# Patient Record
Sex: Female | Born: 1990 | Race: White | Hispanic: No | Marital: Single | Smoking: Never smoker
Health system: Southern US, Community
[De-identification: ages and names within clinical notes are randomized; demographics above are authoritative.]

---
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
## 2010-07-01 ENCOUNTER — Emergency Department (HOSPITAL_COMMUNITY): Admission: EM | Admit: 2010-07-01 | Discharge: 2010-07-01 | Payer: Self-pay | Admitting: Emergency Medicine

## 2022-05-05 ENCOUNTER — Encounter (HOSPITAL_COMMUNITY): Payer: Self-pay

## 2022-05-05 ENCOUNTER — Ambulatory Visit (HOSPITAL_COMMUNITY)
Admission: EM | Admit: 2022-05-05 | Discharge: 2022-05-05 | Disposition: A | Discharge status: Home / Self Care | Payer: Medicaid Other | Attending: Physician Assistant | Admitting: Physician Assistant

## 2022-05-05 DIAGNOSIS — R519 Headache, unspecified: Secondary | ICD-10-CM

## 2022-05-05 DIAGNOSIS — L03211 Cellulitis of face: Secondary | ICD-10-CM

## 2022-05-05 MED ORDER — IBUPROFEN 600 MG PO TABS: 600.0000 mg | ORAL_TABLET | Freq: Three times a day (TID) | ORAL | 0 refills | Status: AC

## 2022-05-05 MED ORDER — SULFAMETHOXAZOLE-TRIMETHOPRIM 800-160 MG PO TABS: 1.0000 | ORAL_TABLET | Freq: Two times a day (BID) | ORAL | 0 refills | Status: AC

## 2022-05-05 NOTE — ED Triage Notes (Signed)
Patient had pimple pop up yesterday on the left upper lip. States tried to pop and pick at it. Area is now inflamed, swollen, and red. Pain radiating up into the left ear.

## 2022-05-05 NOTE — Discharge Instructions (Signed)
Advised take ibuprofen 600 mg every 8 hours with food to help reduce the pain and swelling. Advise use ice therapy, 10 minutes on 20 minutes off, 3-4 times a day to help reduce the swelling and pain. Advised to take the Bactrim DS 1 every 12 hours to treat the cellulitis. Advised to follow-up PCP or return to urgent care if symptoms fail to improve.

## 2022-05-05 NOTE — ED Provider Notes (Signed)
[facility]    CSN: [id number] Arrival date & time: 05/05/22  1658      History   Chief Complaint Chief Complaint  Patient presents with   Sore    Left upper lip    HPI Judy Lopez is a 31 y.o. female.   31 year old female presents with left facial pain.  Patient indicates that she had a sore on the left facial area next to the upper lip when she started picking at the area.  She relates over the past 2 days the areas become large, swollen, red, with tenderness and pain.  She relates that the area of redness has widened and she has pain up into the left ear.  She has taken ibuprofen 600 mg which tends to give some relief from the pain and discomfort.  She denies any fever or chills     History reviewed. No pertinent past medical history.  There are no problems to display for this patient.   Past Surgical History:  Procedure Laterality Date   CESAREAN SECTION      OB History   No obstetric history on file.      Home Medications    Prior to Admission medications   Medication Sig Start Date End Date Taking? Authorizing Provider  ibuprofen (ADVIL) 600 MG tablet Take 1 tablet (600 mg total) by mouth 3 (three) times daily. 05/05/22  Yes Ellsworth Lennox, PA-C  sulfamethoxazole-trimethoprim (BACTRIM DS) 800-160 MG tablet Take 1 tablet by mouth 2 (two) times daily for 7 days. 05/05/22 05/12/22 Yes Ellsworth Lennox, PA-C    Family History History reviewed. No pertinent family history.  Social History Social History   Tobacco Use   Smoking status: Never   Smokeless tobacco: Never  Vaping Use   Vaping Use: Every day  Substance Use Topics   Alcohol use: Yes   Drug use: Never     Allergies   Patient has no known allergies.   Review of Systems Review of Systems  Skin:  Positive for wound (left facial sore).     Physical Exam Triage Vital Signs ED Triage Vitals  Enc Vitals Group     BP 05/05/22 1729 127/88     Pulse Rate 05/05/22 1729 61     Resp  05/05/22 1729 16     Temp 05/05/22 1729 98.5 F (36.9 C)     Temp Source 05/05/22 1729 Oral     SpO2 05/05/22 1729 98 %     Weight --      Height 05/05/22 1731 5\' 7"  (1.702 m)     Head Circumference --      Peak Flow --      Pain Score 05/05/22 1731 6     Pain Loc --      Pain Edu? --      Excl. in [place]? --    No data found.  Updated Vital Signs BP 127/88 (BP Location: Left Arm)   Pulse 61   Temp 98.5 F (36.9 C) (Oral)   Resp 16   Ht 5\' 7"  (1.702 m)   LMP 04/29/2022 (Approximate)   SpO2 98%   Visual Acuity Right Eye Distance:   Left Eye Distance:   Bilateral Distance:    Right Eye Near:   Left Eye Near:    Bilateral Near:     Physical Exam Constitutional:      Appearance: Normal appearance.  HENT:     Right Ear: Tympanic membrane and ear canal normal.  Left Ear: Tympanic membrane and ear canal normal.     Mouth/Throat:     Mouth: Mucous membranes are moist.     Pharynx: Oropharynx is clear.  Lymphadenopathy:     Cervical: No cervical adenopathy.  Skin:    Comments: Facial: There is a 0.25 cm pustular crusting area above the upper lip at the outer border that has surrounding redness and 1+ swelling.  There is no active drainage and there is no streaking although there is tenderness 2 to 3 inches away from the wound site.  Neurological:     Mental Status: She is alert.      UC Treatments / Results  Labs (all labs ordered are listed, but only abnormal results are displayed) Labs Reviewed - No data to display  EKG   Radiology No results found.  Procedures Procedures (including critical care time)  Medications Ordered in UC Medications - No data to display  Initial Impression / Assessment and Plan / UC Course  I have reviewed the triage vital signs and the nursing notes.  Pertinent labs & imaging results that were available during my care of the patient were reviewed by me and considered in my medical decision making (see chart for details).     Plan: 1.  Advised to take ibuprofen every 8 hours with food to help reduce pain and swelling. 2.  Advised to use ice therapy, 5 minutes on 15 minutes off 3-4 times throughout the day to help reduce the swelling. 3.  Advised take Bactrim DS every 12 hours to treat the facial infection. 4.  Advised to follow-up with PCP or return to urgent care if symptoms fail to improve. Final Clinical Impressions(s) / UC Diagnoses   Final diagnoses:  Facial pain  Cellulitis of face     Discharge Instructions      Advised take ibuprofen 600 mg every 8 hours with food to help reduce the pain and swelling. Advise use ice therapy, 10 minutes on 20 minutes off, 3-4 times a day to help reduce the swelling and pain. Advised to take the Bactrim DS 1 every 12 hours to treat the cellulitis. Advised to follow-up PCP or return to urgent care if symptoms fail to improve.    ED Prescriptions     Medication Sig Dispense Auth. Provider   sulfamethoxazole-trimethoprim (BACTRIM DS) 800-160 MG tablet Take 1 tablet by mouth 2 (two) times daily for 7 days. 14 tablet Ellsworth Lennox, PA-C   ibuprofen (ADVIL) 600 MG tablet Take 1 tablet (600 mg total) by mouth 3 (three) times daily. 30 tablet Ellsworth Lennox, PA-C      PDMP not reviewed this encounter.   Ellsworth Lennox, PA-C 05/05/22 Flossie Buffy

## 2022-07-03 ENCOUNTER — Ambulatory Visit (INDEPENDENT_AMBULATORY_CARE_PROVIDER_SITE_OTHER): Payer: Medicaid Other

## 2022-07-03 ENCOUNTER — Ambulatory Visit
Admission: RE | Admit: 2022-07-03 | Discharge: 2022-07-03 | Disposition: A | Discharge status: Home / Self Care | Payer: Medicaid Other | Source: Ambulatory Visit | Attending: Physician Assistant | Admitting: Physician Assistant

## 2022-07-03 VITALS — BP 81/57 | HR 66 | Temp 98.2°F | Resp 17

## 2022-07-03 DIAGNOSIS — R0782 Intercostal pain: Secondary | ICD-10-CM | POA: Diagnosis not present

## 2022-07-03 DIAGNOSIS — S299XXA Unspecified injury of thorax, initial encounter: Secondary | ICD-10-CM | POA: Diagnosis not present

## 2022-07-03 NOTE — ED Provider Notes (Signed)
[facility]    CSN: [id number] Arrival date & time: 07/03/22  1248      History   Chief Complaint Chief Complaint  Patient presents with   Rib Injury    Entered by patient    HPI Judy Lopez is a 31 y.o. female.   The history is provided by the patient.    History reviewed. No pertinent past medical history.  There are no problems to display for this patient.   Past Surgical History:  Procedure Laterality Date   CESAREAN SECTION      OB History   No obstetric history on file.      Home Medications    Prior to Admission medications   Medication Sig Start Date End Date Taking? Authorizing Provider  ibuprofen (ADVIL) 600 MG tablet Take 1 tablet (600 mg total) by mouth 3 (three) times daily. 05/05/22   Nyoka Lint, PA-C    Family History Family History  Family history unknown: Yes    Social History Social History   Tobacco Use   Smoking status: Never   Smokeless tobacco: Never  Vaping Use   Vaping Use: Every day  Substance Use Topics   Alcohol use: Yes   Drug use: Never     Allergies   Patient has no known allergies.   Review of Systems Review of Systems  Constitutional:  Negative for chills and fever.  Eyes:  Negative for discharge and redness.  Gastrointestinal:  Negative for abdominal pain, nausea and vomiting.     Physical Exam Triage Vital Signs ED Triage Vitals  Enc Vitals Group     BP 07/03/22 1344 (!) 81/57     Pulse Rate 07/03/22 1344 66     Resp 07/03/22 1344 17     Temp 07/03/22 1344 98.2 F (36.8 C)     Temp Source 07/03/22 1344 Oral     SpO2 07/03/22 1344 98 %     Weight --      Height --      Head Circumference --      Peak Flow --      Pain Score 07/03/22 1343 8     Pain Loc --      Pain Edu? --      Excl. in [place]? --    No data found.  Updated Vital Signs BP (!) 81/57 (BP Location: Left Arm)   Pulse 66   Temp 98.2 F (36.8 C) (Oral)   Resp 17   LMP 06/28/2022   SpO2 98%   Visual  Acuity Right Eye Distance:   Left Eye Distance:   Bilateral Distance:    Right Eye Near:   Left Eye Near:    Bilateral Near:     Physical Exam Vitals and nursing note reviewed.  Constitutional:      General: She is not in acute distress.    Appearance: Normal appearance. She is not ill-appearing.  HENT:     Head: Normocephalic and atraumatic.  Eyes:     Conjunctiva/sclera: Conjunctivae normal.  Cardiovascular:     Rate and Rhythm: Normal rate.  Pulmonary:     Effort: Pulmonary effort is normal.  Neurological:     Mental Status: She is alert.  Psychiatric:        Mood and Affect: Mood normal.        Behavior: Behavior normal.        Thought Content: Thought content normal.      UC Treatments / Results  Labs (all labs ordered are listed, but only abnormal results are displayed) Labs Reviewed - No data to display  EKG   Radiology No results found.  Procedures Procedures (including critical care time)  Medications Ordered in UC Medications - No data to display  Initial Impression / Assessment and Plan / UC Course  I have reviewed the triage vital signs and the nursing notes.  Pertinent labs & imaging results that were available during my care of the patient were reviewed by me and considered in my medical decision making (see chart for details).     *** Final Clinical Impressions(s) / UC Diagnoses   Final diagnoses:  None   Discharge Instructions   None    ED Prescriptions   None    PDMP not reviewed this encounter.
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
## 2022-07-03 NOTE — Discharge Instructions (Signed)
Non displaced fracture visualized on xray.  Use tylenol and ibuprofen for pain control.   Purposely take deep breaths to help prevent infection, further complication.  Follow up with any concerns, report to ED with any worsening symptoms or shortness of breath.
